# Patient Record
Sex: Female | Born: 1945 | Race: White | Hispanic: No
Health system: Southern US, Community
[De-identification: ages and names within clinical notes are randomized; demographics above are authoritative.]

## PROBLEM LIST (undated history)

## (undated) DIAGNOSIS — E785 Hyperlipidemia, unspecified: Secondary | ICD-10-CM

## (undated) DIAGNOSIS — F32A Depression, unspecified: Secondary | ICD-10-CM

## (undated) DIAGNOSIS — N200 Calculus of kidney: Secondary | ICD-10-CM

## (undated) DIAGNOSIS — G8929 Other chronic pain: Secondary | ICD-10-CM

## (undated) DIAGNOSIS — I10 Essential (primary) hypertension: Secondary | ICD-10-CM

## (undated) DIAGNOSIS — M549 Dorsalgia, unspecified: Secondary | ICD-10-CM

## (undated) DIAGNOSIS — F329 Major depressive disorder, single episode, unspecified: Secondary | ICD-10-CM

---

## 2009-04-15 HISTORY — PX: SPINAL FUSION: SHX223

## 2016-10-06 ENCOUNTER — Encounter (HOSPITAL_COMMUNITY): Payer: Self-pay | Admitting: Emergency Medicine

## 2016-10-06 ENCOUNTER — Emergency Department (HOSPITAL_COMMUNITY): Payer: Medicare Other

## 2016-10-06 ENCOUNTER — Emergency Department (HOSPITAL_COMMUNITY)
Admission: EM | Admit: 2016-10-06 | Discharge: 2016-10-06 | Disposition: A | Discharge status: Home / Self Care | Payer: Medicare Other | Attending: Emergency Medicine | Admitting: Emergency Medicine

## 2016-10-06 DIAGNOSIS — Z79899 Other long term (current) drug therapy: Secondary | ICD-10-CM | POA: Insufficient documentation

## 2016-10-06 DIAGNOSIS — Y92480 Sidewalk as the place of occurrence of the external cause: Secondary | ICD-10-CM | POA: Insufficient documentation

## 2016-10-06 DIAGNOSIS — W01198A Fall on same level from slipping, tripping and stumbling with subsequent striking against other object, initial encounter: Secondary | ICD-10-CM | POA: Insufficient documentation

## 2016-10-06 DIAGNOSIS — W19XXXA Unspecified fall, initial encounter: Secondary | ICD-10-CM

## 2016-10-06 DIAGNOSIS — Y999 Unspecified external cause status: Secondary | ICD-10-CM | POA: Insufficient documentation

## 2016-10-06 DIAGNOSIS — Y9389 Activity, other specified: Secondary | ICD-10-CM | POA: Insufficient documentation

## 2016-10-06 DIAGNOSIS — S0990XA Unspecified injury of head, initial encounter: Secondary | ICD-10-CM | POA: Diagnosis present

## 2016-10-06 DIAGNOSIS — I10 Essential (primary) hypertension: Secondary | ICD-10-CM | POA: Diagnosis not present

## 2016-10-06 DIAGNOSIS — S0083XA Contusion of other part of head, initial encounter: Secondary | ICD-10-CM | POA: Diagnosis not present

## 2016-10-06 HISTORY — DX: Dorsalgia, unspecified: M54.9

## 2016-10-06 HISTORY — DX: Major depressive disorder, single episode, unspecified: F32.9

## 2016-10-06 HISTORY — DX: Hyperlipidemia, unspecified: E78.5

## 2016-10-06 HISTORY — DX: Other chronic pain: G89.29

## 2016-10-06 HISTORY — DX: Depression, unspecified: F32.A

## 2016-10-06 HISTORY — DX: Essential (primary) hypertension: I10

## 2016-10-06 HISTORY — DX: Calculus of kidney: N20.0

## 2016-10-06 MED ORDER — MORPHINE SULFATE (PF) 4 MG/ML IV SOLN
4.0000 mg | Freq: Once | INTRAVENOUS | Status: AC
  Administered 2016-10-06: 4 mg via INTRAVENOUS
  Filled 2016-10-06: qty 1

## 2016-10-06 MED ORDER — TETANUS-DIPHTH-ACELL PERTUSSIS 5-2.5-18.5 LF-MCG/0.5 IM SUSP
0.5000 mL | Freq: Once | INTRAMUSCULAR | Status: AC
  Administered 2016-10-06: 0.5 mL via INTRAMUSCULAR
  Filled 2016-10-06: qty 0.5

## 2016-10-06 MED ORDER — BACITRACIN ZINC 500 UNIT/GM EX OINT: 1.0000 "application " | TOPICAL_OINTMENT | Freq: Two times a day (BID) | CUTANEOUS | 1 refills | Status: AC

## 2016-10-06 NOTE — ED Triage Notes (Signed)
Patient with [facility] after tripping and falling, no anticoagulation, denies LOC.  States she hit her head on sidewalk, complains of pain to left side of head, left hip, left knee, and lower back.  Patient alert and oriented and in no apparent distress at this time.

## 2016-10-06 NOTE — ED Provider Notes (Signed)
[facility]EMERGENCY DEPT Provider Note   CSN: [id number] Arrival date & time: 10/06/16  1315     History   Chief Complaint Chief Complaint  Patient presents with  . Fall    HPI Kaylee Garcia is a 71 y.o. female.  Kaylee Garcia is a 71 y.o. Female who presents to the emergency department after a trip and fall just prior to arrival. Patient reports she was moving her car when she tripped on the cement car stop that rests in front of the car in a parking spot. She reports she fell onto her head. She denies loss of consciousness. She complains of a headache and pain to the anterior portion of her forehead. She reports some other chronic pain, but denies any worsening or changes to her pain. She reports she has chronic back pain that has not worsened or changed. She has ambulated prior to arrival. No treatments prior to arrival. She denies use of anticoagulants. She denies fevers, numbness, tingling, weakness, syncope, changes to her vision, new back pain, loss of bladder control, loss of bowel control, seizure-like activity, chest pain, shortness of breath or rashes.   The history is provided by the patient, medical records and a significant other. No language interpreter was used.  Fall  Associated symptoms include headaches. Pertinent negatives include no chest pain, no abdominal pain and no shortness of breath.    Past Medical History:  Diagnosis Date  . Chronic back pain   . Depression   . Hyperlipidemia   . Hypertension   . Kidney stones     There are no active problems to display for this patient.   Past Surgical History:  Procedure Laterality Date  . SPINAL FUSION  2011    OB History    No data available       Home Medications    Prior to Admission medications   Medication Sig Start Date End Date Taking? Authorizing Provider  bacitracin ointment Apply 1 application topically 2 (two) times daily. 10/06/16   Everlene Farrier, PA-C    Family History No family  history on file.  Social History Social History  Substance Use Topics  . Smoking status: Not on file  . Smokeless tobacco: Not on file  . Alcohol use Not on file     Allergies   Septra [sulfamethoxazole-trimethoprim] and Nickel   Review of Systems Review of Systems  Constitutional: Negative for chills and fever.  HENT: Negative for congestion and sore throat.   Eyes: Negative for visual disturbance.  Respiratory: Negative for cough, shortness of breath and wheezing.   Cardiovascular: Negative for chest pain and palpitations.  Gastrointestinal: Negative for abdominal pain, diarrhea, nausea and vomiting.  Genitourinary: Negative for dysuria.  Musculoskeletal: Positive for back pain (chronic and unchanged. ) and neck pain. Negative for gait problem.  Skin: Positive for wound. Negative for rash.  Neurological: Positive for headaches. Negative for dizziness, seizures, syncope, weakness, light-headedness and numbness.     Physical Exam Updated Vital Signs BP 140/70   Pulse (!) 58   Temp 98.2 F (36.8 C) (Oral)   Resp 16   SpO2 96%   Physical Exam  Constitutional: She is oriented to person, place, and time. She appears well-developed and well-nourished. No distress.  Nontoxic appearing.  HENT:  Head: Normocephalic.  Right Ear: External ear normal.  Left Ear: External ear normal.  Nose: Nose normal.  Mouth/Throat: Oropharynx is clear and moist.  Abrasion noted to her right forehead with hematoma as well as  an abrasion noted to the bridge of her nose. No septal hematoma. Mucous numbers are slightly dry. Bilateral tympanic membranes are pearly-gray without erythema or loss of landmarks. No hemotympanum.  Eyes: Conjunctivae and EOM are normal. Pupils are equal, round, and reactive to light. Right eye exhibits no discharge. Left eye exhibits no discharge.  EOMs are intact.  Neck: Normal range of motion. Neck supple.  Cardiovascular: Normal rate, regular rhythm, normal heart  sounds and intact distal pulses.  Exam reveals no gallop and no friction rub.   Bilateral radial, posterior tibialis and dorsalis pedis pulses are intact.    Pulmonary/Chest: Effort normal and breath sounds normal. No respiratory distress. She has no wheezes. She has no rales. She exhibits no tenderness.  Lungs are clear to ascultation bilaterally. Symmetric chest expansion bilaterally. No increased work of breathing. No rales or rhonchi.    Abdominal: Soft. There is no tenderness. There is no guarding.  Musculoskeletal: Normal range of motion. She exhibits no edema, tenderness or deformity.  Patient is spontaneously moving all extremities in a coordinated fashion exhibiting good strength. No midline neck or back tenderness to palpation. Patient's bilateral clavicles are nontender to palpation. Patient's bilateral shoulder, elbow, wrist, hip, knee and ankle joints are supple and nontender to palpation.  Lymphadenopathy:    She has no cervical adenopathy.  Neurological: She is alert and oriented to person, place, and time. No cranial nerve deficit or sensory deficit. She exhibits normal muscle tone. Coordination normal.  Patient is alert and oriented 3. Cranial nerves are intact. Speech is clear and coherent. No pronator drift. Finger to nose intact bilaterally. EOMs are intact. Strength and sensation is intact to her bilateral upper and lower extremity is.  Skin: Skin is warm and dry. Capillary refill takes less than 2 seconds. No rash noted. She is not diaphoretic. No erythema. No pallor.  Psychiatric: She has a normal mood and affect. Her behavior is normal.  Nursing note and vitals reviewed.    ED Treatments / Results  Labs (all labs ordered are listed, but only abnormal results are displayed) Labs Reviewed - No data to display  EKG  EKG Interpretation None       Radiology Ct Head Wo Contrast  Result Date: 10/06/2016 CLINICAL DATA:  Pt fell this morning face forward and has a  large hematoma on her left forehead A cut on her nose and a cut on her left cheek she has head and neck pain EXAM: CT HEAD WITHOUT CONTRAST CT MAXILLOFACIAL WITHOUT CONTRAST CT CERVICAL SPINE WITHOUT CONTRAST TECHNIQUE: Multidetector CT imaging of the head, cervical spine, and maxillofacial structures were performed using the standard protocol without intravenous contrast. Multiplanar CT image reconstructions of the cervical spine and maxillofacial structures were also generated. COMPARISON:  None. FINDINGS: CT HEAD FINDINGS Brain: No acute intracranial hemorrhage. No focal mass lesion. No CT evidence of acute infarction. No midline shift or mass effect. No hydrocephalus. Basilar cisterns are patent. Vascular: No hyperdense vessel or unexpected calcification. Skull: Normal. Negative for fracture or focal lesion. Sinuses/Orbits: Paranasal sinuses and mastoid air cells are clear. Orbits are clear. Other: LEFT frontal scalp hematoma measures 10 mm in depth. No associated skull fracture. Fall cough shown which CT MAXILLOFACIAL FINDINGS The orbital walls are intact. Globes are normal. Intraconal contents are normal. No proptosis. Zygomatic arches are intact. Maxillary sinus walls are intact. Pterygoid plates are intact. No fluid in the maxillary sinuses Mandibular condyles are located.  No mandibular ramus fracture. No abnormalities deep soft  tissues of the neck. CT CERVICAL SPINE FINDINGS Alignment: Normal alignment of the cervical vertebral bodies. Skull base and vertebrae: Normal craniocervical junction. No loss of vertebral body height or disc height. Normal facet articulation. No evidence of fracture. Soft tissues and spinal canal: No prevertebral soft tissue swelling. No perispinal or epidural hematoma. Disc levels: Endplate osteophytosis from C3-C7. Mild joint space narrowing at multiple levels. No subluxation Upper chest: Clear Other: None IMPRESSION: 1. No intracranial trauma. 2. LEFT frontal scalp hematoma  without skull fracture. 3. No facial bone fracture. 4. No cervical spine fracture Electronically Signed   By: Genevive Bi M.D.   On: 10/06/2016 15:42   Ct Cervical Spine Wo Contrast  Result Date: 10/06/2016 CLINICAL DATA:  Pt fell this morning face forward and has a large hematoma on her left forehead A cut on her nose and a cut on her left cheek she has head and neck pain EXAM: CT HEAD WITHOUT CONTRAST CT MAXILLOFACIAL WITHOUT CONTRAST CT CERVICAL SPINE WITHOUT CONTRAST TECHNIQUE: Multidetector CT imaging of the head, cervical spine, and maxillofacial structures were performed using the standard protocol without intravenous contrast. Multiplanar CT image reconstructions of the cervical spine and maxillofacial structures were also generated. COMPARISON:  None. FINDINGS: CT HEAD FINDINGS Brain: No acute intracranial hemorrhage. No focal mass lesion. No CT evidence of acute infarction. No midline shift or mass effect. No hydrocephalus. Basilar cisterns are patent. Vascular: No hyperdense vessel or unexpected calcification. Skull: Normal. Negative for fracture or focal lesion. Sinuses/Orbits: Paranasal sinuses and mastoid air cells are clear. Orbits are clear. Other: LEFT frontal scalp hematoma measures 10 mm in depth. No associated skull fracture. Fall cough shown which CT MAXILLOFACIAL FINDINGS The orbital walls are intact. Globes are normal. Intraconal contents are normal. No proptosis. Zygomatic arches are intact. Maxillary sinus walls are intact. Pterygoid plates are intact. No fluid in the maxillary sinuses Mandibular condyles are located.  No mandibular ramus fracture. No abnormalities deep soft tissues of the neck. CT CERVICAL SPINE FINDINGS Alignment: Normal alignment of the cervical vertebral bodies. Skull base and vertebrae: Normal craniocervical junction. No loss of vertebral body height or disc height. Normal facet articulation. No evidence of fracture. Soft tissues and spinal canal: No  prevertebral soft tissue swelling. No perispinal or epidural hematoma. Disc levels: Endplate osteophytosis from C3-C7. Mild joint space narrowing at multiple levels. No subluxation Upper chest: Clear Other: None IMPRESSION: 1. No intracranial trauma. 2. LEFT frontal scalp hematoma without skull fracture. 3. No facial bone fracture. 4. No cervical spine fracture Electronically Signed   By: Genevive Bi M.D.   On: 10/06/2016 15:42   Ct Maxillofacial Wo Cm  Result Date: 10/06/2016 CLINICAL DATA:  Pt fell this morning face forward and has a large hematoma on her left forehead A cut on her nose and a cut on her left cheek she has head and neck pain EXAM: CT HEAD WITHOUT CONTRAST CT MAXILLOFACIAL WITHOUT CONTRAST CT CERVICAL SPINE WITHOUT CONTRAST TECHNIQUE: Multidetector CT imaging of the head, cervical spine, and maxillofacial structures were performed using the standard protocol without intravenous contrast. Multiplanar CT image reconstructions of the cervical spine and maxillofacial structures were also generated. COMPARISON:  None. FINDINGS: CT HEAD FINDINGS Brain: No acute intracranial hemorrhage. No focal mass lesion. No CT evidence of acute infarction. No midline shift or mass effect. No hydrocephalus. Basilar cisterns are patent. Vascular: No hyperdense vessel or unexpected calcification. Skull: Normal. Negative for fracture or focal lesion. Sinuses/Orbits: Paranasal sinuses and mastoid air cells are  clear. Orbits are clear. Other: LEFT frontal scalp hematoma measures 10 mm in depth. No associated skull fracture. Fall cough shown which CT MAXILLOFACIAL FINDINGS The orbital walls are intact. Globes are normal. Intraconal contents are normal. No proptosis. Zygomatic arches are intact. Maxillary sinus walls are intact. Pterygoid plates are intact. No fluid in the maxillary sinuses Mandibular condyles are located.  No mandibular ramus fracture. No abnormalities deep soft tissues of the neck. CT CERVICAL SPINE  FINDINGS Alignment: Normal alignment of the cervical vertebral bodies. Skull base and vertebrae: Normal craniocervical junction. No loss of vertebral body height or disc height. Normal facet articulation. No evidence of fracture. Soft tissues and spinal canal: No prevertebral soft tissue swelling. No perispinal or epidural hematoma. Disc levels: Endplate osteophytosis from C3-C7. Mild joint space narrowing at multiple levels. No subluxation Upper chest: Clear Other: None IMPRESSION: 1. No intracranial trauma. 2. LEFT frontal scalp hematoma without skull fracture. 3. No facial bone fracture. 4. No cervical spine fracture Electronically Signed   By: Genevive Bi M.D.   On: 10/06/2016 15:42    Procedures Procedures (including critical care time)  Medications Ordered in ED Medications  Tdap (BOOSTRIX) injection 0.5 mL (0.5 mLs Intramuscular Given 10/06/16 1423)  morphine 4 MG/ML injection 4 mg (4 mg Intravenous Given 10/06/16 1433)     Initial Impression / Assessment and Plan / ED Course  I have reviewed the triage vital signs and the nursing notes.  Pertinent labs & imaging results that were available during my care of the patient were reviewed by me and considered in my medical decision making (see chart for details).    This is a 71 y.o. Female who presents to the emergency department after a trip and fall just prior to arrival. Patient reports she was moving her car when she tripped on the cement car stop that rests in front of the car in a parking spot. She reports she fell onto her head. She denies loss of consciousness. She complains of a headache and pain to the anterior portion of her forehead. She reports some other chronic pain, but denies any worsening or changes to her pain. She reports she has chronic back pain that has not worsened or changed. She has ambulated prior to arrival.  On exam the patient is afebrile nontoxic appearing. She has no focal neurological deficits. She has a  hematoma noted to her left forehead. CT head, facial facial and cervical spine were obtained. No intracranial trauma. There is a left frontal scalp hematoma without fracture. No facial bone fracture and no cervical spine fracture. Ad reevaluation patient is resting comfortably in the room. We'll discharge at this time with close follow-up by her primary care doctor. Her significant other is at bedside. I discussed head injury return precautions. She has pain medication at home which she can take for pain. I advised the patient to follow-up with their primary care provider this week. I advised the patient to return to the emergency department with new or worsening symptoms or new concerns. The patient verbalized understanding and agreement with plan.     Final Clinical Impressions(s) / ED Diagnoses   Final diagnoses:  Fall, initial encounter  Contusion of face, initial encounter  Minor head injury, initial encounter    New Prescriptions New Prescriptions   BACITRACIN OINTMENT    Apply 1 application topically 2 (two) times daily.     Everlene Farrier, PA-C 10/06/16 1626    Alvira Monday, MD 10/09/16 [phone]

## 2018-08-22 IMAGING — CT CT HEAD W/O CM
5 of 11 series · 16 of 47 positions shown, 18 images · non-contrast
Comparison: None.

CLINICAL DATA: Pt fell this morning face forward and has a large
hematoma on her left forehead A cut on her nose and a cut on her
left cheek she has head and neck pain

EXAM:
CT HEAD WITHOUT CONTRAST
CT MAXILLOFACIAL WITHOUT CONTRAST
CT CERVICAL SPINE WITHOUT CONTRAST
TECHNIQUE: Multidetector CT imaging of the head, cervical spine, and
maxillofacial structures were performed using the standard protocol
without intravenous contrast. Multiplanar CT image reconstructions
of the cervical spine and maxillofacial structures were also
generated.

[Series 5: head bone · axial · 0.43mm/px · z∈[-85,+11]mm · 4 of 81 slices shown]
[im 17/81  bone]
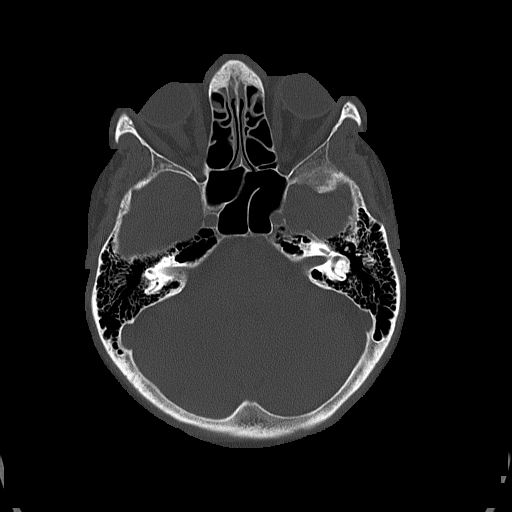
[im 33/81  bone]
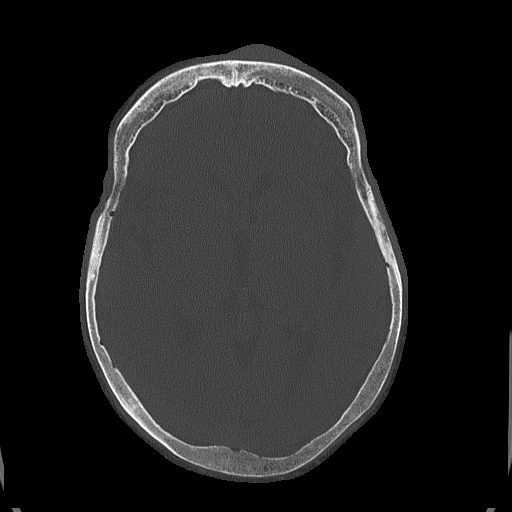
[im 49/81  bone]
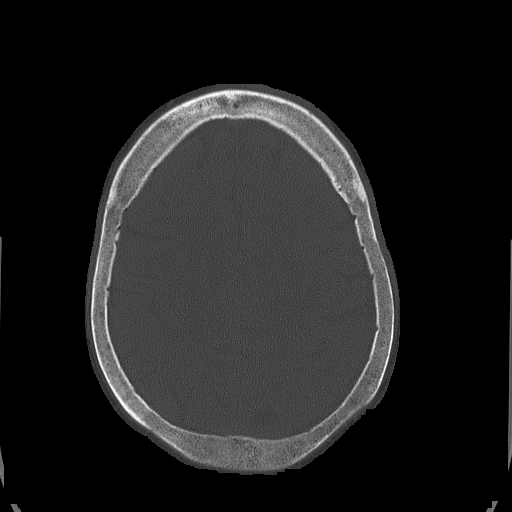
[im 65/81  bone]
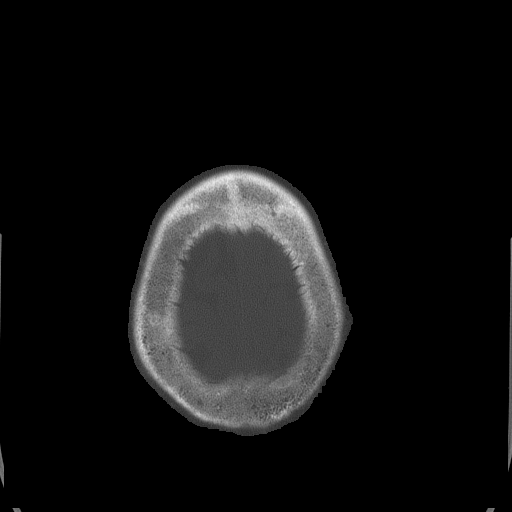

[Series 8: facialbone 2.0 st · axial · 0.35mm/px · z∈[-180,-60]mm · 5 of 90 slices shown, 7 images]
[im 15/90  brain]
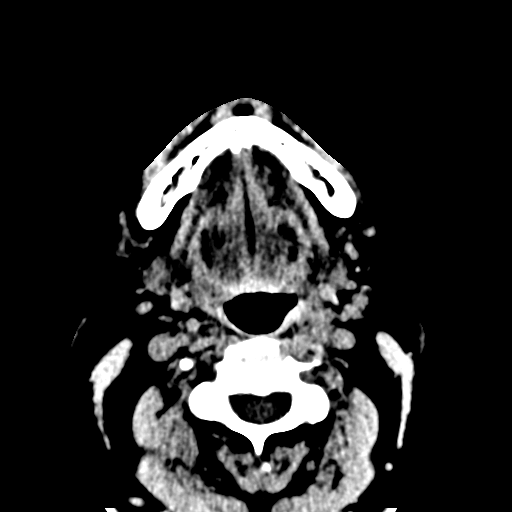
[im 15/90  bone]
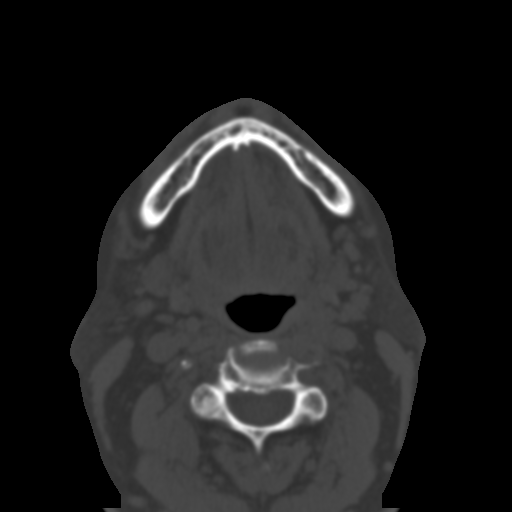
[im 30/90  brain]
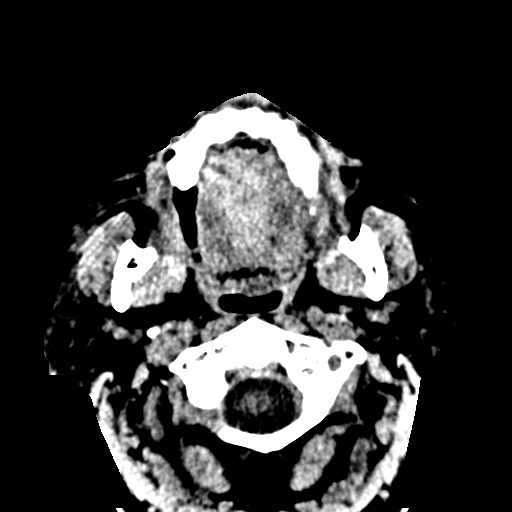
[im 45/90  brain]
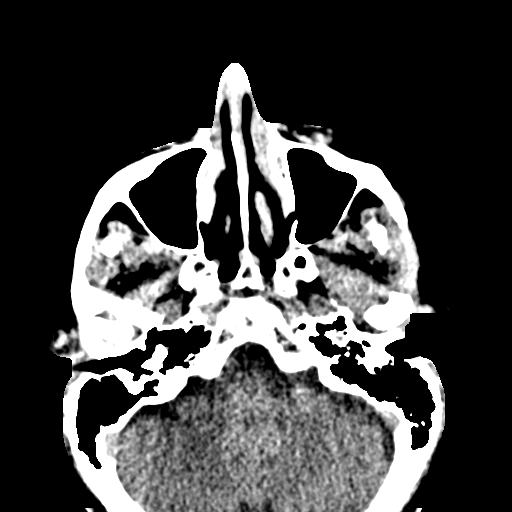
[im 60/90  brain]
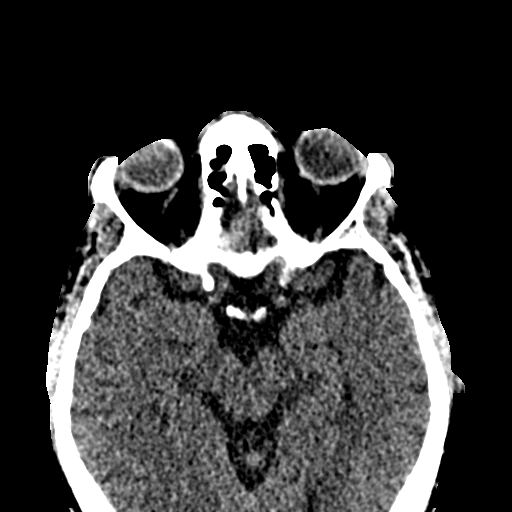
[im 75/90  brain]
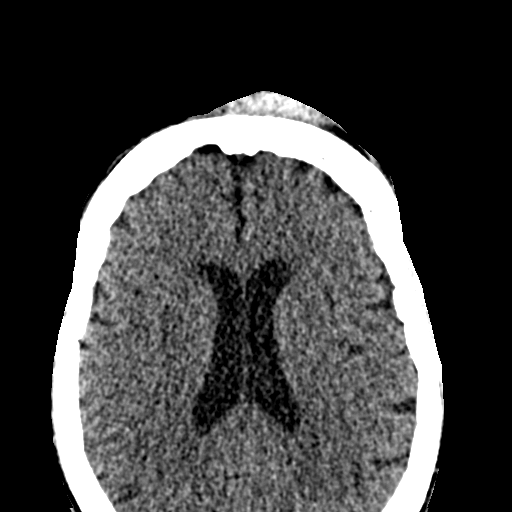
[im 75/90  bone]
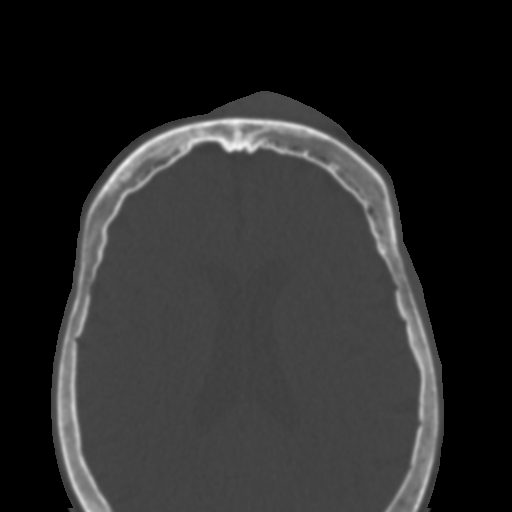

[Series 11: facialbone 2.0 cor st · coronal · 0.35mm/px · 3 of 82 slices shown]
[im 21/82  brain]
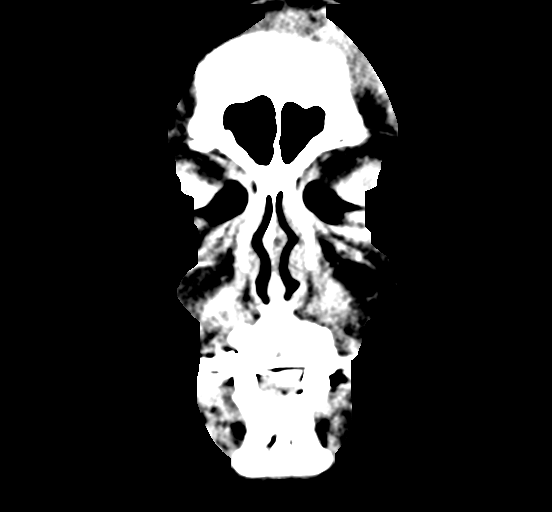
[im 41/82  brain]
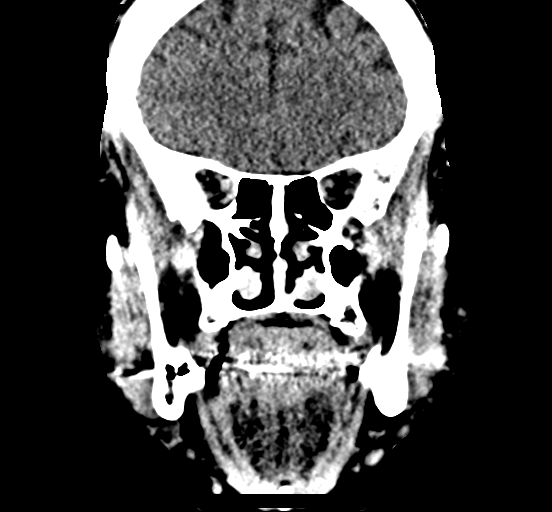
[im 61/82  brain]
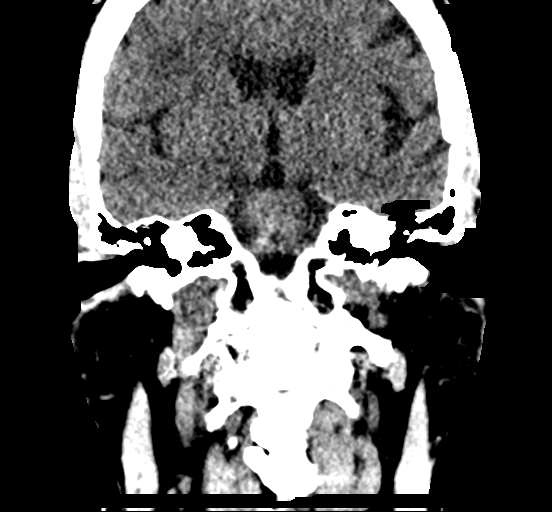

[Series 12: facialbone 2.0 sag st · sagittal · 0.33mm/px · 1 of 76 slices shown]
[im 38/76  brain]
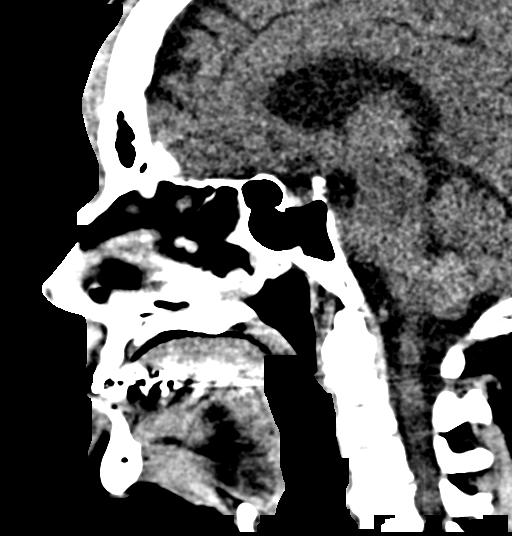

[Series 15: c_spine 2.0 st · axial · 0.29mm/px · z∈[-246,-188]mm · 3 of 88 slices shown]
[im 15/88  brain]
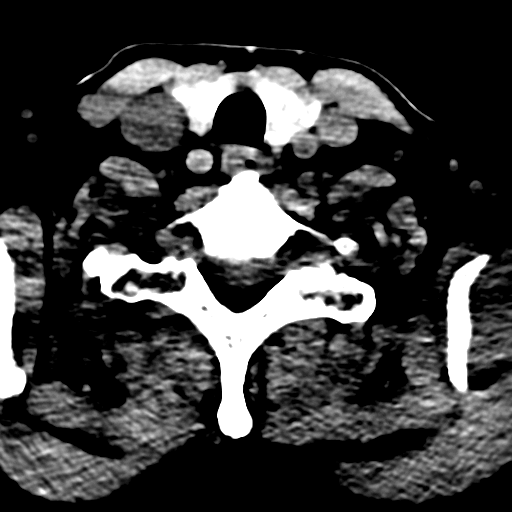
[im 30/88  brain]
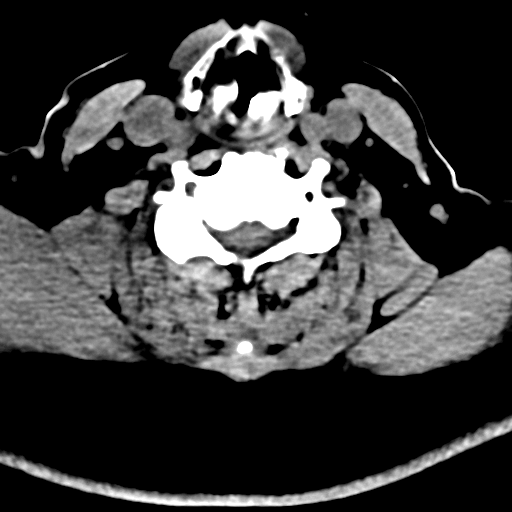
[im 44/88  brain]
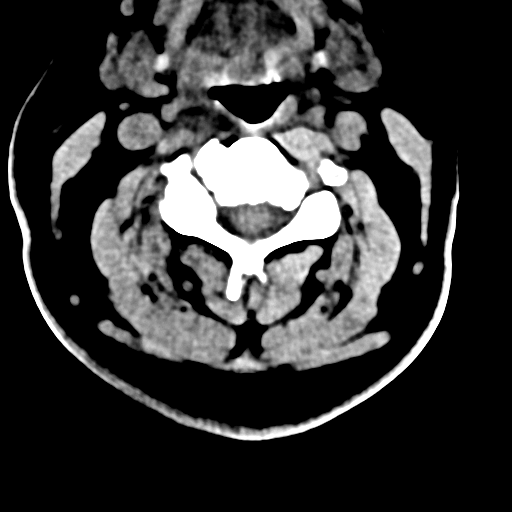

[16 of 47 positions shown; findings below may reference images not displayed]

FINDINGS: CT HEAD FINDINGS

Brain: No acute intracranial hemorrhage. No focal mass lesion. No CT
evidence of acute infarction. No midline shift or mass effect. No
hydrocephalus. Basilar cisterns are patent.

Vascular: No hyperdense vessel or unexpected calcification.

Skull: Normal. Negative for fracture or focal lesion.

Sinuses/Orbits: Paranasal sinuses and mastoid air cells are clear.
Orbits are clear.

Other: LEFT frontal scalp hematoma measures 10 mm in depth. No
associated skull fracture. Fall cough shown which

CT MAXILLOFACIAL FINDINGS

The orbital walls are intact. Globes are normal. Intraconal contents
are normal. No proptosis. Zygomatic arches are intact.

Maxillary sinus walls are intact. Pterygoid plates are intact. No
fluid in the maxillary sinuses

Mandibular condyles are located.  No mandibular ramus fracture.

No abnormalities deep soft tissues of the neck.

CT CERVICAL SPINE FINDINGS

Alignment: Normal alignment of the cervical vertebral bodies.

Skull base and vertebrae: Normal craniocervical junction. No loss of
vertebral body height or disc height. Normal facet articulation. No
evidence of fracture.

Soft tissues and spinal canal: No prevertebral soft tissue swelling.
No perispinal or epidural hematoma.

Disc levels: Endplate osteophytosis from C3-C7. Mild joint space
narrowing at multiple levels. No subluxation

Upper chest: Clear

Other: None
IMPRESSION: 1. No intracranial trauma.
2. LEFT frontal scalp hematoma without skull fracture.
3. No facial bone fracture.
4. No cervical spine fracture
